# Patient Record
Sex: Female | Born: 1990 | Race: White | Hispanic: No | Marital: Married | Smoking: Never smoker
Health system: Southern US, Community
[De-identification: ages and names within clinical notes are randomized; demographics above are authoritative.]

## PROBLEM LIST (undated history)

## (undated) DIAGNOSIS — R569 Unspecified convulsions: Secondary | ICD-10-CM

## (undated) DIAGNOSIS — G43909 Migraine, unspecified, not intractable, without status migrainosus: Secondary | ICD-10-CM

## (undated) DIAGNOSIS — Z3490 Encounter for supervision of normal pregnancy, unspecified, unspecified trimester: Secondary | ICD-10-CM

## (undated) DIAGNOSIS — I7300 Raynaud's syndrome without gangrene: Secondary | ICD-10-CM

## (undated) DIAGNOSIS — D649 Anemia, unspecified: Secondary | ICD-10-CM

## (undated) HISTORY — PX: WISDOM TOOTH EXTRACTION: SHX21

## (undated) HISTORY — PX: TONSILLECTOMY: SUR1361

## (undated) HISTORY — DX: Migraine, unspecified, not intractable, without status migrainosus: G43.909

---

## 2013-04-02 ENCOUNTER — Emergency Department (HOSPITAL_COMMUNITY): Payer: Medicaid Other

## 2013-04-02 ENCOUNTER — Encounter (HOSPITAL_COMMUNITY): Payer: Self-pay | Admitting: *Deleted

## 2013-04-02 ENCOUNTER — Emergency Department (HOSPITAL_COMMUNITY)
Admission: EM | Admit: 2013-04-02 | Discharge: 2013-04-02 | Disposition: A | Discharge status: Home / Self Care | Payer: Medicaid Other | Attending: Emergency Medicine | Admitting: Emergency Medicine

## 2013-04-02 DIAGNOSIS — O3091 Multiple gestation, unspecified, first trimester: Secondary | ICD-10-CM

## 2013-04-02 DIAGNOSIS — F411 Generalized anxiety disorder: Secondary | ICD-10-CM | POA: Insufficient documentation

## 2013-04-02 DIAGNOSIS — R209 Unspecified disturbances of skin sensation: Secondary | ICD-10-CM | POA: Insufficient documentation

## 2013-04-02 DIAGNOSIS — O99340 Other mental disorders complicating pregnancy, unspecified trimester: Secondary | ICD-10-CM | POA: Insufficient documentation

## 2013-04-02 DIAGNOSIS — O30009 Twin pregnancy, unspecified number of placenta and unspecified number of amniotic sacs, unspecified trimester: Secondary | ICD-10-CM | POA: Insufficient documentation

## 2013-04-02 DIAGNOSIS — Z79899 Other long term (current) drug therapy: Secondary | ICD-10-CM | POA: Insufficient documentation

## 2013-04-02 HISTORY — DX: Encounter for supervision of normal pregnancy, unspecified, unspecified trimester: Z34.90

## 2013-04-02 HISTORY — DX: Unspecified convulsions: R56.9

## 2013-04-02 LAB — BASIC METABOLIC PANEL
BUN: 6 mg/dL (ref 6–23)
CO2: 23 mEq/L (ref 19–32)
Calcium: 9.2 mg/dL (ref 8.4–10.5)
Chloride: 102 mEq/L (ref 96–112)
Glucose, Bld: 87 mg/dL (ref 70–99)
Potassium: 3.4 mEq/L — ABNORMAL LOW (ref 3.5–5.1)

## 2013-04-02 LAB — CBC WITH DIFFERENTIAL/PLATELET
Basophils Absolute: 0 10*3/uL (ref 0.0–0.1)
Eosinophils Absolute: 0.1 10*3/uL (ref 0.0–0.7)
Eosinophils Relative: 1 % (ref 0–5)
Hemoglobin: 12.6 g/dL (ref 12.0–15.0)
Lymphocytes Relative: 23 % (ref 12–46)
Lymphs Abs: 1.4 10*3/uL (ref 0.7–4.0)
MCH: 28.5 pg (ref 26.0–34.0)
Monocytes Relative: 7 % (ref 3–12)
Neutro Abs: 4.4 10*3/uL (ref 1.7–7.7)
Neutrophils Relative %: 69 % (ref 43–77)
Platelets: 205 10*3/uL (ref 150–400)
RBC: 4.42 MIL/uL (ref 3.87–5.11)
RDW: 13.5 % (ref 11.5–15.5)
WBC: 6.4 10*3/uL (ref 4.0–10.5)

## 2013-04-02 LAB — HCG, QUANTITATIVE, PREGNANCY: hCG, Beta Chain, Quant, S: 84709 m[IU]/mL — ABNORMAL HIGH (ref <5)

## 2013-04-02 MED ORDER — SODIUM CHLORIDE 0.9 % IV BOLUS (SEPSIS)
1000.0000 mL | Freq: Once | INTRAVENOUS | Status: AC
  Administered 2013-04-02: 1000 mL via INTRAVENOUS

## 2013-04-02 NOTE — ED Provider Notes (Signed)
CSN: [id number]     Arrival date & time 04/02/13  1029 History   First MD Initiated Contact with Patient 04/02/13 1044     Chief Complaint  Patient presents with  . Numbness   (Consider location/radiation/quality/duration/timing/severity/associated sxs/prior Treatment) The history is provided by the patient and medical records.   Patient is G1PO approx [redacted] weeks gestation with twins in single gestational sac.  Pt arrived at her OB-GYN appt earlier this morning but when she got out of the car to go inside she began having some SOB and paresthesias of BUE, BUE, and left side of her face.  Mom notes pt does not have a hx of intermittent seizures following a head trauma several years ago.  Pt usually has prodrome of sx prior to having a seizure, some of which were present this morning.  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  Last seizure approx 2 years ago.  No recent fevers, sweats, or chills.  Denies any abdominal pain, vaginal bleeding, or discharge.  No dysuria or hematuria.  Pt is currently taking prenatal vitamins.  Has been having some morning sickness with typical nausea and vomiting but none for the past 3 days.  Has been eating and drinking well.  Pt expresses concern that she missed her u/s this morning and would like it completed here. OB-GYN-- Ernestina Penna  Past Medical History  Diagnosis Date  . Pregnancy   . Seizures    History reviewed. No pertinent past surgical history. History reviewed. No pertinent family history. History  Substance Use Topics  . Smoking status: Never Smoker   . Smokeless tobacco: Not on file  . Alcohol Use: No   OB History   Grav Para Term Preterm Abortions TAB SAB Ect Mult Living   1              Review of Systems  Neurological:       Paresthesias  All other systems reviewed and are negative.    Allergies  Dilantin; Dilaudid; and Phenobarbital  Home Medications    Current Outpatient Rx  Name  Route  Sig  Dispense  Refill  . flintstones complete (FLINTSTONES) 60 MG chewable tablet   Oral   Chew 2 tablets by mouth daily.         . folic acid (FOLVITE) 400 MCG tablet   Oral   Take 400 mcg by mouth daily.          BP 129/80  Pulse 96  Temp(Src) 99 F (37.2 C) (Oral)  Resp 17  SpO2 100%  Physical Exam  Nursing note and vitals reviewed. Constitutional: She is oriented to person, place, and time. She appears well-developed and well-nourished. No distress.  HENT:  Head: Normocephalic and atraumatic.  Mouth/Throat: Oropharynx is clear and moist.  Eyes: Conjunctivae and EOM are normal. Pupils are equal, round, and reactive to light.  Neck: Normal range of motion.  Cardiovascular: Normal rate, regular rhythm and normal heart sounds.   Pulmonary/Chest: Effort normal and breath sounds normal. No respiratory distress. She has no wheezes.  Abdominal: Soft. Bowel sounds are normal. There is no tenderness. There is no guarding.  Musculoskeletal: Normal range of motion.  Neurological: She is alert and oriented to person, place, and time. She has normal strength. She displays no tremor. No cranial nerve deficit or sensory deficit. She displays no seizure activity. Gait normal.  CN grossly intact, equal grip strength in BUE, moves all  extremities without ataxia, sensation intact, no facial droop appreciated  Skin: Skin is warm and dry. She is not diaphoretic.  Psychiatric: Her mood appears anxious.  Pt appears anxious, tearful on exam    ED Course  Procedures (including critical care time) Labs Review Labs Reviewed  BASIC METABOLIC PANEL - Abnormal; Notable for the following:    Potassium 3.4 (*)    Creatinine, Ser 0.49 (*)    All other components within normal limits  HCG, QUANTITATIVE, PREGNANCY - Abnormal; Notable for the following:    hCG, Beta Chain, Quant, S 40981 (*)    All other components within normal limits  CBC WITH DIFFERENTIAL    Imaging Review US Ob Comp Less 14 Wks  04/02/2013   CLINICAL DATA:  Abdominal pain. 7 weeks 2 days by LMP.  EXAM: TWIN OBSTETRIC <14WK [place] AND TRANSVAGINAL OB US  COMPARISON:  None.  FINDINGS: TWIN 1  Intrauterine gestational sac: Single  Yolk sac:  Present  Embryo:  Present  Cardiac Activity: Present  Heart Rate: 152 bpm  CRL:  12.5  mm   7 w 3 d                  [place] EDC: 11/16/2013  TWIN 2  Intrauterine gestational sac: Single  Yolk sac:  Present  Embryo:  Present  Cardiac Activity: Present  Heart Rate: 155 bpm  CRL:  13.6  mm   7 w 4d                  [place] EDC: 11/15/2012  Maternal uterus/adnexae: The ovaries have a normal appearance.  IMPRESSION: 1. Moderate amniotic monochorionic twin gestation corresponding to age of 7 weeks 3 days. By ultrasound, of the EDC is 11/16/2013. 2. Size and dates correlate well. 3. Ovaries have a normal appearance.   Electronically Signed   By: Rosalie Gums M.D.   On: 04/02/2013 13:15   US Ob Comp Addl Gest Less 14 Wks  04/02/2013   CLINICAL DATA:  Abdominal pain. 7 weeks 2 days by LMP.  EXAM: TWIN OBSTETRIC <14WK [place] AND TRANSVAGINAL OB US  COMPARISON:  None.  FINDINGS: TWIN 1  Intrauterine gestational sac: Single  Yolk sac:  Present  Embryo:  Present  Cardiac Activity: Present  Heart Rate: 152 bpm  CRL:  12.5  mm   7 w 3 d                  [place] EDC: 11/16/2013  TWIN 2  Intrauterine gestational sac: Single  Yolk sac:  Present  Embryo:  Present  Cardiac Activity: Present  Heart Rate: 155 bpm  CRL:  13.6  mm   7 w 4d                  [place] EDC: 11/15/2012  Maternal uterus/adnexae: The ovaries have a normal appearance.  IMPRESSION: 1. Moderate amniotic monochorionic twin gestation corresponding to age of 7 weeks 3 days. By ultrasound, of the EDC is 11/16/2013. 2. Size and dates correlate well. 3. Ovaries have a normal appearance.   Electronically Signed   By: Rosalie Gums M.D.   On: 04/02/2013 13:15    MDM   1. Multiple gestation, first trimester     11:01 AM Pt assessed,  appears anxious and tearful.  No focal neuro deficits appreciated on physical exam.  Will obtain basic labs and u/s.  Pt has not had a pelvic exam performed thus far in her pregnancy.  I  have offered to do this for her but she would prefer just to have u/s.  IVF bolus given.  2:04 PM Pt re-evaluated, sx resolved.  U/s as above-- both twins with normal cardiac activity.  Labs largely WNL.  Again offered pelvic exam to pt, states she would prefer to have this done with her OB-GYN at her appt next Monday 04/08/13.  Instructed to continue taking prenatals and monitor sx closely.  FU with OB-GYN as previously scheduled-- copies of labs and u/s given for physician review.  Discussed plan with pt and mom, they acknowledged understanding and agreed with plan of care.  Return precautions advised.  Garlon Hatchet, PA-C 04/02/13 1533

## 2013-04-02 NOTE — ED Notes (Signed)
Patient is [redacted] weeks pregnant (same sac)  with twins and today around 9am she started having numbess to her upper and lower extremities.  Patient has history of seizures.  Patient is alert and oriented xe.  Patient denies any nausea.  Patient is also experiencing shortness of breath.

## 2013-04-05 NOTE — ED Provider Notes (Signed)
Medical screening examination/treatment/procedure(s) were performed by non-physician practitioner and as supervising physician I was immediately available for consultation/collaboration.  Williom Cedar T Veyda Kaufman, MD 04/05/13 1800 

## 2014-05-12 ENCOUNTER — Encounter (HOSPITAL_COMMUNITY): Payer: Self-pay | Admitting: *Deleted

## 2015-05-11 ENCOUNTER — Telehealth: Payer: Self-pay | Admitting: Neurology

## 2015-05-11 ENCOUNTER — Encounter: Payer: Self-pay | Admitting: Neurology

## 2015-05-11 ENCOUNTER — Ambulatory Visit (INDEPENDENT_AMBULATORY_CARE_PROVIDER_SITE_OTHER): Payer: BLUE CROSS/BLUE SHIELD | Admitting: Neurology

## 2015-05-11 VITALS — BP 100/61 | HR 68 | Ht 59.0 in | Wt 112.0 lb

## 2015-05-11 DIAGNOSIS — G43709 Chronic migraine without aura, not intractable, without status migrainosus: Secondary | ICD-10-CM | POA: Diagnosis not present

## 2015-05-11 DIAGNOSIS — R569 Unspecified convulsions: Secondary | ICD-10-CM | POA: Diagnosis not present

## 2015-05-11 DIAGNOSIS — IMO0002 Reserved for concepts with insufficient information to code with codable children: Secondary | ICD-10-CM

## 2015-05-11 MED ORDER — NORTRIPTYLINE HCL 25 MG PO CAPS: ORAL_CAPSULE | ORAL | Status: DC

## 2015-05-11 MED ORDER — NORTRIPTYLINE HCL 25 MG PO CAPS: ORAL_CAPSULE | ORAL | Status: AC

## 2015-05-11 MED ORDER — SUMATRIPTAN SUCCINATE 50 MG PO TABS: 50.0000 mg | ORAL_TABLET | ORAL | Status: DC | PRN

## 2015-05-11 NOTE — Telephone Encounter (Signed)
Rx's have been resent, receipt confirmed by pharmacy.

## 2015-05-11 NOTE — Progress Notes (Signed)
PATIENT: Gina Thompson DOB: 01/29/1991  Chief Complaint  Patient presents with  . Headache    She started having daily headaches two months ago.  She has tried OTC NSAIDS without relief.  . Seizures    Says she was diagnosed with seizures in high school and placed on Topamax.  She started making bad grades and lost too much weight so it was discontinued.  She has not been on any other anticonvulsant.  Her last seizure was two years ago, during her pregnancy.     HISTORICAL  Gina Thompson is a 24 year old right-handed female, seen in refer by her primary care physician Dr. Satira Sark for evaluation of headaches in October 30 first 2016, she reported previous seizure-like activity  She reported a history of migraine since elementary school, after she was hit by ice ball, later basketball, her typical migraine starting from the left occipital region, spreading forward to become left retro-orbital area severe pounding headache with associated light noise sensitivity, nauseous, her headache can last for few days, trigger for her headaches are menstruation, stress, hungry sleep deprivation.  Her headache was at her best during her pregnancy about 2 years ago  Since August 2016, she began to have frequent headaches, she also complains depression, anxiety symptoms since she lost one of her twin boys, she now has almost daily moderate to severe headaches, especially since she started to go back to work,  She also reported seizure-like activity, initial episode was at second grade, proceeded by vision blackout, then loss of consciousness, woke up with severe headaches, over the years, she had few recurrent episode, usually triggered by stress, hormonal change, oftentimes with severe headaches, per patient, she had extensive evaluation during teenager years, had hospital admission at [facility], and hospitals at [place] region, normal MRI of the brain, EEG, the final conclusion was seizure-like  activity, related to the migraine,  Most recent passing out was during her pregnancy 2 years ago in 2014,  REVIEW OF SYSTEMS: Full 14 system review of systems performed and notable only for weight gain, fatigue, palpitation, ringing ears, loss of vision, eye pain, headaches, numbness, dizziness, tremor, not enough sleep,  ALLERGIES: Allergies  Allergen Reactions  . Dilantin [Phenytoin Sodium Extended]     Shortness of breath  . Dilaudid [Hydromorphone Hcl]     Induce seizures  . Phenobarbital     Shortness of breath    HOME MEDICATIONS: Current Outpatient Prescriptions  Medication Sig Dispense Refill  . Acetaminophen (TYLENOL PO) Take by mouth as needed.    . Aspirin-Acetaminophen-Caffeine (EXCEDRIN EXTRA STRENGTH PO) Take by mouth as needed.    Marland Kitchen ibuprofen (ADVIL,MOTRIN) 200 MG tablet Take 200 mg by mouth as needed.     No current facility-administered medications for this visit.    PAST MEDICAL HISTORY: Past Medical History  Diagnosis Date  . Pregnancy   . Seizures (HCC)   . Migraine     PAST SURGICAL HISTORY: Past Surgical History  Procedure Laterality Date  . Tonsillectomy    . Wisdom tooth extraction    . Cesarean section      FAMILY HISTORY: Family History  Problem Relation Age of Onset  . Bone cancer Father   . Brain cancer Paternal Grandfather   . Melanoma Paternal Grandfather   . Thyroid disease      All four grandparents  . Healthy Mother     SOCIAL HISTORY:  Social History   Social History  . Marital Status: Married  Spouse Name: N/A  . Number of Children: 2  . Years of Education: Some clg   Occupational History  . Customer Service Rep    Social History Main Topics  . Smoking status: Never Smoker   . Smokeless tobacco: Not on file  . Alcohol Use: 0.0 oz/week    0 Standard drinks or equivalent per week     Comment: Rarely - social settings  . Drug Use: No  . Sexual Activity: Not on file   Other Topics Concern  . Not on file    Social History Narrative   Lives at home with husband and son.  She had twins but one passed away.   Right-handed.   2-4 cups caffeine daily.     PHYSICAL EXAM   Filed Vitals:   05/11/15 0810  BP: 100/61  Pulse: 68  Height:  (1.499 m)  Weight: 112 lb (50.803 kg)    Not recorded      Body mass index is 22.61 kg/(m^2).  PHYSICAL EXAMNIATION:  Gen: NAD, conversant, well nourised, obese, well groomed                     Cardiovascular: Regular rate rhythm, no peripheral edema, warm, nontender. Eyes: Conjunctivae clear without exudates or hemorrhage Neck: Supple, no carotid bruise. Pulmonary: Clear to auscultation bilaterally   NEUROLOGICAL EXAM:  MENTAL STATUS: Speech:    Speech is normal; fluent and spontaneous with normal comprehension.  Cognition:     Orientation to time, place and person     Normal recent and remote memory     Normal Attention span and concentration     Normal Language, naming, repeating,spontaneous speech     Fund of knowledge   CRANIAL NERVES: CN II: Visual fields are full to confrontation. Fundoscopic exam is normal with sharp discs and no vascular changes. Pupils are round equal and briskly reactive to light. CN III, IV, VI: extraocular movement are normal. No ptosis. CN V: Facial sensation is intact to pinprick in all 3 divisions bilaterally. Corneal responses are intact.  CN VII: Face is symmetric with normal eye closure and smile. CN VIII: Hearing is normal to rubbing fingers CN IX, X: Palate elevates symmetrically. Phonation is normal. CN XI: Head turning and shoulder shrug are intact CN XII: Tongue is midline with normal movements and no atrophy.  MOTOR: There is no pronator drift of out-stretched arms. Muscle bulk and tone are normal. Muscle strength is normal.  REFLEXES: Reflexes are 2+ and symmetric at the biceps, triceps, knees, and ankles. Plantar responses are flexor.  SENSORY: Intact to light touch, pinprick,  position sense, and vibration sense are intact in fingers and toes.  COORDINATION: Rapid alternating movements and fine finger movements are intact. There is no dysmetria on finger-to-nose and heel-knee-shin.    GAIT/STANCE: Posture is normal. Gait is steady with normal steps, base, arm swing, and turning. Heel and toe walking are normal. Tandem gait is normal.  Romberg is absent.   DIAGNOSTIC DATA (LABS, IMAGING, TESTING) - I reviewed patient records, labs, notes, testing and imaging myself where available.   ASSESSMENT AND PLAN  Gina Thompson is a 24 y.o. female   Chronic migraine with visual aura  Nortriptyline 25 mg titrating to 50 mg every night as preventative medications  Imitrex 50 mg as needed Depression Seizure-like activity  EEG   Levert Feinstein, M.D. Ph.D.  [facility] [address], [address] [place], [place] [address] Ph: [phone] Fax: [phone]  CC: Referring Provider

## 2015-05-11 NOTE — Telephone Encounter (Signed)
Patient is calling and states that [facility] [address]  Could you please resend Rx Nortriptyline 25 mg and Rx Sumatriptan 50 mg.  Thanks!

## 2015-05-13 ENCOUNTER — Telehealth: Payer: Self-pay | Admitting: Neurology

## 2015-05-13 ENCOUNTER — Encounter: Payer: Self-pay | Admitting: *Deleted

## 2015-05-13 ENCOUNTER — Other Ambulatory Visit: Payer: Self-pay | Admitting: *Deleted

## 2015-05-13 DIAGNOSIS — G43809 Other migraine, not intractable, without status migrainosus: Secondary | ICD-10-CM

## 2015-05-13 MED ORDER — RIZATRIPTAN BENZOATE 5 MG PO TBDP: 5.0000 mg | ORAL_TABLET | ORAL | Status: DC | PRN

## 2015-05-13 NOTE — Telephone Encounter (Signed)
Patient needed rx sent to different pharmacy - changed and resent - other rx canceled.

## 2015-05-13 NOTE — Telephone Encounter (Signed)
Spoke to [place] - she used Imitrex for the first time yesterday - says it is too sedating.  Her employer made her leave work.  She is requesting an alternate medication to try that will not cause her to feel drowsy.

## 2015-05-13 NOTE — Telephone Encounter (Signed)
Pt called and states that she can not function while taking SUMAtriptan (IMITREX) 50 MG tablet. States that her migraines were worse as well. Please call and advise [phone]

## 2015-05-13 NOTE — Telephone Encounter (Signed)
Please let patient know, I have written Maxalt 5 mg as needed

## 2015-06-02 ENCOUNTER — Other Ambulatory Visit: Payer: Self-pay | Admitting: *Deleted

## 2015-06-02 ENCOUNTER — Encounter: Payer: Self-pay | Admitting: *Deleted

## 2015-06-02 DIAGNOSIS — G43809 Other migraine, not intractable, without status migrainosus: Secondary | ICD-10-CM

## 2015-06-02 MED ORDER — DICLOFENAC POTASSIUM(MIGRAINE) 50 MG PO PACK: PACK | ORAL | Status: AC

## 2015-06-02 NOTE — Telephone Encounter (Signed)
Left message for a return call

## 2015-06-02 NOTE — Telephone Encounter (Signed)
Pt called to r/s EEG( appt is now 12/22). She sts cannot take nortriptyline (PAMELOR) 25 MG capsule 2 tab at night as she cannnot function the next day although the HA's have improved in frequency since starting this medication9 taking 1 pill). She can not take Maxalt at all, cannot function. So she has no medication to take during the day. She is wondering if she should keep appt 11/5 or move to after EEG. Please call and advise

## 2015-06-02 NOTE — Telephone Encounter (Signed)
Per Dr. Terrace ArabiaYan, decrease nortriptyline to 25mg  at bedtime, try Cambia and keep follow up appt on 06/15/15.  Patient agreeable to plan.

## 2015-06-10 ENCOUNTER — Other Ambulatory Visit: Payer: BLUE CROSS/BLUE SHIELD

## 2015-06-15 ENCOUNTER — Ambulatory Visit (INDEPENDENT_AMBULATORY_CARE_PROVIDER_SITE_OTHER): Payer: BLUE CROSS/BLUE SHIELD | Admitting: Neurology

## 2015-06-15 ENCOUNTER — Encounter: Payer: Self-pay | Admitting: Neurology

## 2015-06-15 VITALS — BP 96/65 | HR 94 | Ht 59.0 in | Wt 111.0 lb

## 2015-06-15 DIAGNOSIS — G43009 Migraine without aura, not intractable, without status migrainosus: Secondary | ICD-10-CM

## 2015-06-15 DIAGNOSIS — R569 Unspecified convulsions: Secondary | ICD-10-CM | POA: Insufficient documentation

## 2015-06-15 DIAGNOSIS — G43909 Migraine, unspecified, not intractable, without status migrainosus: Secondary | ICD-10-CM | POA: Insufficient documentation

## 2015-06-15 MED ORDER — DIHYDROERGOTAMINE MESYLATE 4 MG/ML NA SOLN: 1.0000 | NASAL | Status: AC | PRN

## 2015-06-15 MED ORDER — NORTRIPTYLINE HCL 10 MG PO CAPS: ORAL_CAPSULE | ORAL | Status: AC

## 2015-06-15 NOTE — Progress Notes (Signed)
Chief Complaint  Patient presents with  . Migraine    She is only able to tolerate nortriptyline  at bedtime.  Reports her headaches have decreased but she is still getting 2-3 per week.  Cambia relieves her pain but does not resolve it completely.  . Seizures    No seizure activity reported.  She has a pending EEG on 07/02/15.      PATIENT: Gina Thompson DOB: 07/24/1990  Chief Complaint  Patient presents with  . Migraine    She is only able to tolerate nortriptyline  at bedtime.  Reports her headaches have decreased but she is still getting 2-3 per week.  Cambia relieves her pain but does not resolve it completely.  . Seizures    No seizure activity reported.  She has a pending EEG on 07/02/15.     HISTORICAL  Gina Thompson is a 24 year old right-handed female, seen in refer by her primary care physician Dr. Satira Sark for evaluation of headaches in October 30 first 2016, she reported previous seizure-like activity  She reported a history of migraine since elementary school, after she was hit by ice ball, later basketball, her typical migraine starting from the left occipital region, spreading forward to become left retro-orbital area severe pounding headache with associated light noise sensitivity, nauseous, her headache can last for few days, trigger for her headaches are menstruation, stress, hungry sleep deprivation.  Her headache was at her best during her pregnancy about 2 years ago  Since August 2016, she began to have frequent headaches, she also complains depression, anxiety symptoms since she lost one of her twin boys, she now has almost daily moderate to severe headaches, especially since she started to go back to work,  She also reported seizure-like activity, initial episode was at second grade, proceeded by vision blackout, then loss of consciousness, woke up with severe headaches, over the years, she had few recurrent episode, usually triggered by stress, hormonal  change, oftentimes with severe headaches, per patient, she had extensive evaluation during teenager years, had hospital admission at [facility], and hospitals at [place] region, normal MRI of the brain, EEG, the final conclusion was seizure-like activity, related to the migraine,  Most recent passing out was during her pregnancy 2 years ago in 2014,  UPDATE Dec 5th 2016: She is able to tolerate nortriptyline 25 mg every night, which did help her headache, she has less headache now, at least 50% less, her typical migraine last about one day,Cambia does help, but does not completely take it away.  Her mood has improved with nortriptyline  She has tried imitrex, maxalt, she could not remember anything after taking it, it does not help her migraine.   REVIEW OF SYSTEMS: Full 14 system review of systems performed and notable only for palpitation, constipation, headaches  ALLERGIES: Allergies  Allergen Reactions  . Dilantin [Phenytoin Sodium Extended]     Shortness of breath  . Dilaudid [Hydromorphone Hcl]     Induce seizures  . Phenobarbital     Shortness of breath    HOME MEDICATIONS: Current Outpatient Prescriptions  Medication Sig Dispense Refill  . Acetaminophen (TYLENOL PO) Take by mouth as needed.    . Diclofenac Potassium (CAMBIA) 50 MG PACK Take one pack at onset of migraine.  May repeat in two hours, if needed.  Max of two packs in 24 hours. 12 each 5  . ibuprofen (ADVIL,MOTRIN) 200 MG tablet Take 200 mg by mouth as needed.    . nortriptyline (PAMELOR)  25 MG capsule 1 tablet every night for one week, then 2 tablets every night 60 capsule 3   No current facility-administered medications for this visit.    PAST MEDICAL HISTORY: Past Medical History  Diagnosis Date  . Pregnancy   . Seizures (HCC)   . Migraine     PAST SURGICAL HISTORY: Past Surgical History  Procedure Laterality Date  . Tonsillectomy    . Wisdom tooth extraction    . Cesarean section      FAMILY  HISTORY: Family History  Problem Relation Age of Onset  . Bone cancer Father   . Brain cancer Paternal Grandfather   . Melanoma Paternal Grandfather   . Thyroid disease      All four grandparents  . Healthy Mother     SOCIAL HISTORY:  Social History   Social History  . Marital Status: Married    Spouse Name: N/A  . Number of Children: 2  . Years of Education: Some clg   Occupational History  . Customer Service Rep    Social History Main Topics  . Smoking status: Never Smoker   . Smokeless tobacco: Not on file  . Alcohol Use: 0.0 oz/week    0 Standard drinks or equivalent per week     Comment: Rarely - social settings  . Drug Use: No  . Sexual Activity: Not on file   Other Topics Concern  . Not on file   Social History Narrative   Lives at home with husband and son.  She had twins but one passed away.   Right-handed.   2-4 cups caffeine daily.     PHYSICAL EXAM   Filed Vitals:   06/15/15 0755  BP: 96/65  Pulse: 94  Height: 4\' 11"  (1.499 m)  Weight: 111 lb (50.349 kg)    Not recorded      Body mass index is 22.41 kg/(m^2).  PHYSICAL EXAMNIATION:  Gen: NAD, conversant, well nourised, obese, well groomed                     Cardiovascular: Regular rate rhythm, no peripheral edema, warm, nontender. Eyes: Conjunctivae clear without exudates or hemorrhage Neck: Supple, no carotid bruise. Pulmonary: Clear to auscultation bilaterally   NEUROLOGICAL EXAM:  MENTAL STATUS: Speech:    Speech is normal; fluent and spontaneous with normal comprehension.  Cognition:     Orientation to time, place and person     Normal recent and remote memory     Normal Attention span and concentration     Normal Language, naming, repeating,spontaneous speech     Fund of knowledge   CRANIAL NERVES: CN II: Visual fields are full to confrontation. Fundoscopic exam is normal with sharp discs and no vascular changes. Pupils are round equal and briskly reactive to light. CN  III, IV, VI: extraocular movement are normal. No ptosis. CN V: Facial sensation is intact to pinprick in all 3 divisions bilaterally. Corneal responses are intact.  CN VII: Face is symmetric with normal eye closure and smile. CN VIII: Hearing is normal to rubbing fingers CN IX, X: Palate elevates symmetrically. Phonation is normal. CN XI: Head turning and shoulder shrug are intact CN XII: Tongue is midline with normal movements and no atrophy.  MOTOR: There is no pronator drift of out-stretched arms. Muscle bulk and tone are normal. Muscle strength is normal.  REFLEXES: Reflexes are 2+ and symmetric at the biceps, triceps, knees, and ankles. Plantar responses are flexor.  SENSORY: Intact to  light touch, pinprick, position sense, and vibration sense are intact in fingers and toes.  COORDINATION: Rapid alternating movements and fine finger movements are intact. There is no dysmetria on finger-to-nose and heel-knee-shin.    GAIT/STANCE: Posture is normal. Gait is steady with normal steps, base, arm swing, and turning. Heel and toe walking are normal. Tandem gait is normal.  Romberg is absent.   DIAGNOSTIC DATA (LABS, IMAGING, TESTING) - I reviewed patient records, labs, notes, testing and imaging myself where available.   ASSESSMENT AND PLAN  Delmy Holdren is a 24 y.o. female   Chronic migraine with visual aura  Nortriptyline 25 mg titrating to 50 mg every night as preventative medications  Cambia  as needed. May add on Benadryl, Tylenol prn  Depression Seizure-like activity  EEG   Levert Feinstein, M.D. Ph.D.  [facility] [address] [place], [place] [address] Ph: [phone] Fax: [phone]  CC: Referring Provider

## 2015-06-15 NOTE — Patient Instructions (Signed)
You may take Benadryl, even Tylenol together with Cambia during severe prolonged migraine headaches

## 2015-07-02 ENCOUNTER — Ambulatory Visit (INDEPENDENT_AMBULATORY_CARE_PROVIDER_SITE_OTHER): Payer: BLUE CROSS/BLUE SHIELD

## 2015-07-02 DIAGNOSIS — IMO0002 Reserved for concepts with insufficient information to code with codable children: Secondary | ICD-10-CM

## 2015-07-02 DIAGNOSIS — G43709 Chronic migraine without aura, not intractable, without status migrainosus: Secondary | ICD-10-CM

## 2015-07-02 DIAGNOSIS — R569 Unspecified convulsions: Secondary | ICD-10-CM

## 2015-07-02 NOTE — Procedures (Signed)
    History:  Gina Thompson is a 24 year old patient with a history of migraine headaches, she has had episodes of loss of consciousness preceded by vision blackout and accompanied by headache since she was in second grade. The patient is being evaluated for these episodes.  This is a routine EEG. No skull defects are noted. Medications include Tylenol, Cambia, Migranal, ibuprofen, and nortriptyline.   EEG classification: Normal awake  Description of the recording: The background rhythms of this recording consists of a fairly well modulated medium amplitude alpha rhythm of 9 Hz that is reactive to eye opening and closure. As the record progresses, the patient appears to remain in the waking state throughout the recording. Photic stimulation was performed, resulting in a bilateral and symmetric photic driving response. Hyperventilation was also performed, resulting in a minimal buildup of the background rhythm activities without significant slowing seen. At no time during the recording does there appear to be evidence of spike or spike wave discharges or evidence of focal slowing. EKG monitor shows no evidence of cardiac rhythm abnormalities with a heart rate of 72.  Impression: This is a normal EEG recording in the waking state. No evidence of ictal or interictal discharges are seen.

## 2015-07-07 ENCOUNTER — Telehealth: Payer: Self-pay

## 2015-07-07 NOTE — Telephone Encounter (Signed)
Left voicemail with results.

## 2015-07-07 NOTE — Telephone Encounter (Signed)
-----   Message from York Spanielharles K Willis, MD sent at 07/02/2015  6:24 PM EST ----- The EEG study on this patient was normal. Please contact the patient.

## 2015-09-15 ENCOUNTER — Ambulatory Visit: Payer: BLUE CROSS/BLUE SHIELD | Admitting: Adult Health

## 2016-01-25 LAB — OB RESULTS CONSOLE HEPATITIS B SURFACE ANTIGEN: Hepatitis B Surface Ag: NEGATIVE

## 2016-01-25 LAB — OB RESULTS CONSOLE RUBELLA ANTIBODY, IGM: RUBELLA: IMMUNE

## 2016-01-25 LAB — OB RESULTS CONSOLE GC/CHLAMYDIA
Chlamydia: NEGATIVE
GC PROBE AMP, GENITAL: NEGATIVE

## 2016-01-25 LAB — OB RESULTS CONSOLE ANTIBODY SCREEN: Antibody Screen: NEGATIVE

## 2016-01-25 LAB — OB RESULTS CONSOLE ABO/RH: RH Type: POSITIVE

## 2016-01-25 LAB — OB RESULTS CONSOLE RPR: RPR: NONREACTIVE

## 2016-01-25 LAB — OB RESULTS CONSOLE HIV ANTIBODY (ROUTINE TESTING): HIV: NONREACTIVE

## 2016-06-08 ENCOUNTER — Inpatient Hospital Stay (HOSPITAL_COMMUNITY): Payer: BLUE CROSS/BLUE SHIELD

## 2016-06-08 ENCOUNTER — Encounter (HOSPITAL_COMMUNITY): Payer: Self-pay | Admitting: *Deleted

## 2016-06-08 ENCOUNTER — Inpatient Hospital Stay (HOSPITAL_COMMUNITY)
Admission: AD | Admit: 2016-06-08 | Discharge: 2016-06-09 | Disposition: A | Discharge status: Home / Self Care | Payer: BLUE CROSS/BLUE SHIELD | Source: Ambulatory Visit | Attending: Emergency Medicine | Admitting: Emergency Medicine

## 2016-06-08 DIAGNOSIS — R0602 Shortness of breath: Secondary | ICD-10-CM | POA: Diagnosis not present

## 2016-06-08 DIAGNOSIS — E876 Hypokalemia: Secondary | ICD-10-CM | POA: Insufficient documentation

## 2016-06-08 DIAGNOSIS — Z79899 Other long term (current) drug therapy: Secondary | ICD-10-CM | POA: Insufficient documentation

## 2016-06-08 DIAGNOSIS — R079 Chest pain, unspecified: Secondary | ICD-10-CM

## 2016-06-08 DIAGNOSIS — R071 Chest pain on breathing: Secondary | ICD-10-CM | POA: Diagnosis not present

## 2016-06-08 DIAGNOSIS — O9989 Other specified diseases and conditions complicating pregnancy, childbirth and the puerperium: Secondary | ICD-10-CM | POA: Insufficient documentation

## 2016-06-08 DIAGNOSIS — D509 Iron deficiency anemia, unspecified: Secondary | ICD-10-CM | POA: Diagnosis present

## 2016-06-08 DIAGNOSIS — Z3A29 29 weeks gestation of pregnancy: Secondary | ICD-10-CM | POA: Diagnosis not present

## 2016-06-08 DIAGNOSIS — A419 Sepsis, unspecified organism: Secondary | ICD-10-CM

## 2016-06-08 LAB — COMPREHENSIVE METABOLIC PANEL
ALK PHOS: 93 U/L (ref 38–126)
ALT: 10 U/L — ABNORMAL LOW (ref 14–54)
ANION GAP: 6 (ref 5–15)
AST: 14 U/L — ABNORMAL LOW (ref 15–41)
Albumin: 2.6 g/dL — ABNORMAL LOW (ref 3.5–5.0)
BILIRUBIN TOTAL: 0.3 mg/dL (ref 0.3–1.2)
BUN: 6 mg/dL (ref 6–20)
CO2: 21 mmol/L — ABNORMAL LOW (ref 22–32)
Calcium: 7.9 mg/dL — ABNORMAL LOW (ref 8.9–10.3)
Chloride: 107 mmol/L (ref 101–111)
Creatinine, Ser: 0.46 mg/dL (ref 0.44–1.00)
GFR calc Af Amer: >60 mL/min (ref >60)
Glucose, Bld: 98 mg/dL (ref 65–99)
POTASSIUM: 2.9 mmol/L — AB (ref 3.5–5.1)
Sodium: 134 mmol/L — ABNORMAL LOW (ref 135–145)
TOTAL PROTEIN: 6 g/dL — AB (ref 6.5–8.1)

## 2016-06-08 LAB — CBC
HCT: 24.3 % — ABNORMAL LOW (ref 36.0–46.0)
Hemoglobin: 8 g/dL — ABNORMAL LOW (ref 12.0–15.0)
MCH: 26.5 pg (ref 26.0–34.0)
MCHC: 32.9 g/dL (ref 30.0–36.0)
MCV: 80.5 fL (ref 78.0–100.0)
Platelets: 259 10*3/uL (ref 150–400)
RBC: 3.02 MIL/uL — ABNORMAL LOW (ref 3.87–5.11)
RDW: 13.9 % (ref 11.5–15.5)
WBC: 9.7 10*3/uL (ref 4.0–10.5)

## 2016-06-08 LAB — I-STAT TROPONIN, ED: TROPONIN I, POC: 0 ng/mL (ref 0.00–0.08)

## 2016-06-08 LAB — TYPE AND SCREEN
ABO/RH(D): A POS
ANTIBODY SCREEN: NEGATIVE

## 2016-06-08 LAB — I-STAT CG4 LACTIC ACID, ED: Lactic Acid, Venous: 0.49 mmol/L — ABNORMAL LOW (ref 0.5–1.9)

## 2016-06-08 LAB — MAGNESIUM
MAGNESIUM: 0.6 mg/dL — AB (ref 1.7–2.4)
Magnesium: 3.1 mg/dL — ABNORMAL HIGH (ref 1.7–2.4)

## 2016-06-08 LAB — GLUCOSE, CAPILLARY: GLUCOSE-CAPILLARY: 98 mg/dL (ref 65–99)

## 2016-06-08 LAB — BRAIN NATRIURETIC PEPTIDE: B Natriuretic Peptide: 30 pg/mL (ref 0.0–100.0)

## 2016-06-08 LAB — LACTIC ACID, PLASMA: LACTIC ACID, VENOUS: 1.4 mmol/L (ref 0.5–1.9)

## 2016-06-08 LAB — ABO/RH: ABO/RH(D): A POS

## 2016-06-08 LAB — APTT: APTT: 26 s (ref 24–36)

## 2016-06-08 LAB — D-DIMER, QUANTITATIVE: D-Dimer, Quant: 1.24 ug/mL-FEU — ABNORMAL HIGH (ref 0.00–0.50)

## 2016-06-08 LAB — TROPONIN I: TROPONIN I: 0.1 ng/mL — AB (ref <0.03)

## 2016-06-08 LAB — PROTIME-INR
INR: 1.06
Prothrombin Time: 13.9 seconds (ref 11.4–15.2)

## 2016-06-08 LAB — POTASSIUM: Potassium: 3.5 mmol/L (ref 3.5–5.1)

## 2016-06-08 LAB — PROCALCITONIN

## 2016-06-08 LAB — FETAL FIBRONECTIN: Fetal Fibronectin: NEGATIVE

## 2016-06-08 MED ORDER — MAGNESIUM SULFATE 2 GM/50ML IV SOLN
2.0000 g | Freq: Once | INTRAVENOUS | Status: AC
  Administered 2016-06-08: 2 g via INTRAVENOUS
  Filled 2016-06-08: qty 50

## 2016-06-08 MED ORDER — LABETALOL HCL 5 MG/ML IV SOLN: 20.0000 mg | INTRAVENOUS | Status: DC | PRN

## 2016-06-08 MED ORDER — MAGNESIUM SULFATE 50 % IJ SOLN
2.0000 g/h | INTRAVENOUS | Status: DC
  Filled 2016-06-08: qty 80

## 2016-06-08 MED ORDER — SODIUM CHLORIDE 0.9 % IV BOLUS (SEPSIS)
1000.0000 mL | Freq: Once | INTRAVENOUS | Status: AC
  Administered 2016-06-08: 1000 mL via INTRAVENOUS

## 2016-06-08 MED ORDER — POTASSIUM CHLORIDE CRYS ER 20 MEQ PO TBCR
40.0000 meq | EXTENDED_RELEASE_TABLET | Freq: Once | ORAL | Status: AC
  Administered 2016-06-08: 40 meq via ORAL
  Filled 2016-06-08: qty 2

## 2016-06-08 MED ORDER — IOPAMIDOL (ISOVUE-370) INJECTION 76%
100.0000 mL | Freq: Once | INTRAVENOUS | Status: AC | PRN
  Administered 2016-06-08: 100 mL via INTRAVENOUS

## 2016-06-08 MED ORDER — SODIUM CHLORIDE 0.9 % IV SOLN
INTRAVENOUS | Status: DC
  Filled 2016-06-08 (×2): qty 1000

## 2016-06-08 MED ORDER — POTASSIUM CHLORIDE CRYS ER 20 MEQ PO TBCR: 20.0000 meq | EXTENDED_RELEASE_TABLET | Freq: Two times a day (BID) | ORAL | 0 refills | Status: AC

## 2016-06-08 MED ORDER — ASPIRIN 81 MG PO CHEW
324.0000 mg | CHEWABLE_TABLET | Freq: Once | ORAL | Status: AC
  Administered 2016-06-08: 324 mg via ORAL
  Filled 2016-06-08: qty 4

## 2016-06-08 MED ORDER — POTASSIUM CHLORIDE 20 MEQ/15ML (10%) PO SOLN
40.0000 meq | Freq: Every day | ORAL | Status: DC
  Administered 2016-06-08: 40 meq via ORAL
  Filled 2016-06-08: qty 30

## 2016-06-08 MED ORDER — HYDRALAZINE HCL 20 MG/ML IJ SOLN: 5.0000 mg | INTRAMUSCULAR | Status: DC | PRN

## 2016-06-08 MED ORDER — MAGNESIUM SULFATE BOLUS VIA INFUSION
4.0000 g | Freq: Once | INTRAVENOUS | Status: DC
Start: 2016-06-08 — End: 2016-06-09
  Filled 2016-06-08: qty 500

## 2016-06-08 NOTE — ED Provider Notes (Signed)
[facility]-EMER[facility]ENCY DEPT Provider Note   CSN: [id number] Arrival date & time: 06/08/16  1232     History   Chief Complaint Chief Complaint  Patient presents with  . Chest Pain  . Shortness of Breath    HPI Gina Thompson is a 25 y.o. female.  HPI  This is a 25 year old G2 P1 [redacted] weeks pregnant presents to [place] ED [facility]. She complains of left anterior chest pain that began last night and has been sharp in nature at 8-9 out of 10 in severity. It is worsened with inspiration . She denies any nasal congestion, cough, abdominal pain.  [facility] EKG with T-wave inversion in anterior leads, elevated troponin at 0.1, hypokalemia at 2.9, anemia at 8.0, and hypomagnesemia at 0.6. She had a lactic acid checked that was normal. She had a MAXIMUM TEMPERATURE of 100.9. She has been mildly tachycardic and systolic blood pressures have ranged from 76-101 with one outlier of 176 which I suspect was not correct.  CT angiogram of the chest revealed no evidence of pulmonary embolism, pericardial effusion, or infiltrate. She had some slight pulmonary effusions. MAU note reports that patient was obstetric stable with fetal heart rate category 1 with moderate or ability and no contractions. They report cervix closed and thick.   Past Medical History:  Diagnosis Date  . Migraine   . Pregnancy   . Seizures [facility](HCC)     Patient Active Problem List   Diagnosis Date Noted  . Migraine 06/15/2015  . Seizure-like activity (HCC) 06/15/2015    Past Surgical History:  Procedure Laterality Date  . CESAREAN SECTION    . TONSILLECTOMY    . WISDOM TOOTH EXTRACTION      OB History    Gravida Para Term Preterm AB Living   2             SAB TAB Ectopic Multiple Live Births                   Home Medications    Prior to Admission medications   Medication Sig Start Date End Date Taking? Authorizing Provider    Acetaminophen (TYLENOL PO) Take by mouth as needed.    Historical Provider, MD  Diclofenac Potassium (CAMBIA) 50 MG PACK Take one pack at onset of migraine.  May repeat in two hours, if needed.  Max of two packs in 24 hours. 06/02/15   Levert FeinsteinYijun Yan, MD  dihydroergotamine (MIGRANAL) 4 MG/ML nasal spray Place 1 spray into the nose as needed for migraine. Use in one nostril as directed.  No more than 4 sprays in one hour 06/15/15   Levert FeinsteinYijun Yan, MD  ibuprofen (ADVIL,MOTRIN) 200 MG tablet Take 200 mg by mouth as needed.    Historical Provider, MD  nortriptyline (PAMELOR) 10 MG capsule Take with nortriptyline 25 mg tablets, titrating to 50 mg every night 06/15/15   Levert FeinsteinYijun Yan, MD  nortriptyline (PAMELOR) 25 MG capsule 1 tablet every night for one week, then 2 tablets every night 05/11/15   Levert FeinsteinYijun Yan, MD    Family History Family History  Problem Relation Age of Onset  . Bone cancer Father   . Healthy Mother   . Brain cancer Paternal Grandfather   . Melanoma Paternal Grandfather   . Thyroid disease      All four grandparents    Social History Social History  Substance Use Topics  . Smoking status: Never Smoker  .  Smokeless tobacco: Not on file  . Alcohol use 0.0 oz/week     Comment: Rarely - social settings     Allergies   Dilantin [phenytoin sodium extended]; Dilaudid [hydromorphone hcl]; and Phenobarbital   Review of Systems Review of Systems   Physical Exam Updated Vital Signs BP 99/62 (BP Location: Right Arm)   Pulse 103   Temp 99.6 F (37.6 C) (Oral)   Resp 16   Wt 60.1 kg   SpO2 100%   BMI 26.78 kg/m   Physical Exam  Constitutional: She is oriented to person, place, and time. She appears well-developed and well-nourished.  HENT:  Head: Normocephalic and atraumatic.  Right Ear: External ear normal.  Left Ear: External ear normal.  Nose: Nose normal.  Mouth/Throat: Oropharynx is clear and moist.  Eyes: Conjunctivae and EOM are normal. Pupils are equal, round, and  reactive to light.  Neck: Normal range of motion. Neck supple.  Cardiovascular: Tachycardia present.   Pulmonary/Chest: Effort normal and breath sounds normal.  Abdominal: Soft. Bowel sounds are normal.  Gravid consistent with dates  Musculoskeletal: Normal range of motion.  Neurological: She is alert and oriented to person, place, and time.  Skin: Skin is warm. Capillary refill takes less than 2 seconds. There is pallor.  Psychiatric: She has a normal mood and affect.  Nursing note and vitals reviewed.    ED Treatments / Results  Labs (all labs ordered are listed, but only abnormal results are displayed) Labs Reviewed  CBC - Abnormal; Notable for the following:       Result Value   RBC 3.02 (*)    Hemoglobin 8.0 (*)    HCT 24.3 (*)    All other components within normal limits  COMPREHENSIVE METABOLIC PANEL - Abnormal; Notable for the following:    Sodium 134 (*)    Potassium 2.9 (*)    CO2 21 (*)    Calcium 7.9 (*)    Total Protein 6.0 (*)    Albumin 2.6 (*)    AST 14 (*)    ALT 10 (*)    All other components within normal limits  TROPONIN I - Abnormal; Notable for the following:    Troponin I 0.10 (*)    All other components within normal limits  D-DIMER, QUANTITATIVE (NOT AT [facility]) - Abnormal; Notable for the following:    D-Dimer, Quant 1.24 (*)    All other components within normal limits  MAGNESIUM - Abnormal; Notable for the following:    Magnesium 0.6 (*)    All other components within normal limits  CULTURE, BLOOD (ROUTINE X 2)  CULTURE, BLOOD (ROUTINE X 2)  CULTURE, OB URINE  GLUCOSE, CAPILLARY  BRAIN NATRIURETIC PEPTIDE  LACTIC ACID, PLASMA  PROTIME-INR  APTT  FETAL FIBRONECTIN  PROTEIN / CREATININE RATIO, URINE  TROPONIN I  TROPONIN I  LACTIC ACID, PLASMA  PROCALCITONIN  TYPE AND SCREEN  ABO/RH    EKG  EKG Interpretation  Date/Time:  Wednesday June 08 2016 16:51:43 EST Ventricular Rate:  104 PR Interval:    QRS Duration: 75 QT  Interval:  346 QTC Calculation: 456 R Axis:   58 Text Interpretation:  Sinus tachycardia Confirmed by Ayleen Mckinstry MD, Duwayne Heck [phone]) on 06/08/2016 5:52:22 PM       Radiology Ct Angio Chest Pe W And/or Wo Contrast  Result Date: 06/08/2016 CLINICAL DATA:  Chest pain. Severe shortness of breath. Third trimester pregnancy. Twenty-nine weeks pregnant. EXAM: CT ANGIOGRAPHY CHEST WITH CONTRAST TECHNIQUE: Multidetector CT imaging of  the chest was performed using the standard protocol during bolus administration of intravenous contrast. Multiplanar CT image reconstructions and MIPs were obtained to evaluate the vascular anatomy. CONTRAST:  100 mL Isovue 370 COMPARISON:  None. FINDINGS: Cardiovascular: Pulmonary trial opacification is excellent. No focal filling defects are evident to suggest pulmonary emboli. The heart size is normal. No significant pleural or pericardial effusion is present. Mediastinum/Nodes: No significant mediastinal or axillary adenopathy is present. Lungs/Pleura: Mild dependent atelectasis is present. No focal nodule, mass, or airspace disease is evident. Upper Abdomen: No acute abnormality. Musculoskeletal: No chest wall abnormality. No acute or significant osseous findings. Review of the MIP images confirms the above findings. IMPRESSION: Negative CTA of the chest. No acute or focal lesion to explain chest pain or shortness of breath. Electronically Signed   By: Marin Robertshristopher  Mattern M.D.   On: 06/08/2016 14:35    Procedures Procedures (including critical care time)  Medications Ordered in ED Medications  sodium chloride 0.9 % 1,000 mL infusion (not administered)  hydrALAZINE (APRESOLINE) injection 5-10 mg (not administered)  labetalol (NORMODYNE,TRANDATE) injection 20-40 mg (not administered)  magnesium bolus via infusion 4 g (not administered)  magnesium sulfate 40 g in lactated ringers 500 mL (0.08 g/mL) OB infusion (not administered)  aspirin chewable tablet 324 mg (not  administered)  iopamidol (ISOVUE-370) 76 % injection 100 mL (100 mLs Intravenous Contrast Given 06/08/16 1408)     Initial Impression / Assessment and Plan / ED Course  I have reviewed the triage vital signs and the nursing notes.  Pertinent labs & imaging results that were available during my care of the patient were reviewed by me and considered in my medical decision making (see chart for details).  Clinical Course    1- chest pain- positive troponin, ekg changes resolved here, no evidence of pe, pericardial effusion- DDx- pleuritis, myocarditis, D.W.  Discussed with Dr. SwazilandJordan- low index of suspicion for cardiac etiology with normalized ekg and atypical pain- 9:39 PM Troponin [facility].  Recheck here normal.  2- hypokalemia- oral repletion being given here-11:03 PM repeat potassium 3.5  3- hypomagnesemia- severe- iv magnesium ordered. 11:03 PM Repeat magnesium 3.1 4-pregnancy- appears stable-  5- anemia-  Discussed with [facility]. She states that Dr. Kennieth Francoisavan saw the patient there advised transfer for medical care here. Discussed the patient with hospitalist and they will not admit pregnant patient.  Discussed with Drs. Taavon and Fogelman.  Plan recheck in office on Friday.   Final Clinical Impressions(s) / ED Diagnoses   Final diagnoses:  Chest pain made worse by breathing  [redacted] weeks gestation of pregnancy  Hypokalemia  Hypomagnesemia  Chest pain, unspecified type    New Prescriptions New Prescriptions   No medications on file     Margarita Grizzleanielle Ethne Jeon, MD 06/08/16 2307

## 2016-06-08 NOTE — MAU Note (Signed)
Critical Mag level reported to Dr Ernestina PennaFogleman at desk

## 2016-06-08 NOTE — Discharge Instructions (Signed)
Take potassium as prescribed.  Drink plenty of fluids.  Call Dr. Algie CofferFogelman for recheck on Friday. Return if worse at any time.

## 2016-06-08 NOTE — MAU Provider Note (Signed)
History    Patient Gina Thompson is a 25 year old G2P0 here with chest pain and tachycardia. Denies nausea and vomiting. [facility] Monday with lightheadedness. She developed chest pain last night (Tuesday, November 28) and [facility] back [facility] to be seen this am. Dr. Algie CofferFogelman sent her to the office to be evaluated and requested GI cocktail, EKG and IV fluids. Patient drove herself here and presented to the MAU for treatment. Her EKG was ordered stat and the Patient was seen and evaluated by this provider at 1300. Patient was alert and oriented times 3 in bed; answering questions about her medical history. At approximately 1320, while her IV was being started, patient stated "I feel like I'm going to pass out".  Patient then closed her eyes and became non-responsive. The emergency response was called. 02 administered at 10 L per ml , 18 guage IVs were placed in in her left hand and right AC and NS infused. Temp was 100.2 and Blood Pressure was 176/109. With fluid replacement her blood pressure has stablized although she complains of intermittent chest pain that radiates to her back. Sepsis protocol was initiated due to fever and low blood pressure, patient was sent to CT scan. EKG reviewed with cardiologist over the phone, no acute events are visible on EKG.    Patient has a history of head trauma and seizure disorder, as well as high risk ob history. Currently this pregnancy is being followed for IUGR.   CSN: [id number]  Arrival date and time: 06/08/16 1232   None     Chief Complaint  Patient presents with  . Chest Pain  . Shortness of Breath   HPI  OB History    Gravida Para Term Preterm AB Living   2             SAB TAB Ectopic Multiple Live Births                  Past Medical History:  Diagnosis Date  . Migraine   . Pregnancy   . Seizures (HCC)     Past Surgical History:  Procedure Laterality Date  . CESAREAN SECTION    .  TONSILLECTOMY    . WISDOM TOOTH EXTRACTION      Family History  Problem Relation Age of Onset  . Bone cancer Father   . Brain cancer Paternal Grandfather   . Melanoma Paternal Grandfather   . Thyroid disease      All four grandparents  . Healthy Mother     Social History  Substance Use Topics  . Smoking status: Never Smoker  . Smokeless tobacco: Not on file  . Alcohol use 0.0 oz/week     Comment: Rarely - social settings    Allergies:  Allergies  Allergen Reactions  . Dilantin [Phenytoin Sodium Extended]     Shortness of breath  . Dilaudid [Hydromorphone Hcl]     Induce seizures  . Phenobarbital     Shortness of breath    Prescriptions Prior to Admission  Medication Sig Dispense Refill Last Dose  . Acetaminophen (TYLENOL PO) Take by mouth as needed.   Taking  . Diclofenac Potassium (CAMBIA) 50 MG PACK Take one pack at onset of migraine.  May repeat in two hours, if needed.  Max of two packs in 24 hours. 12 each 5 Taking  . dihydroergotamine (MIGRANAL) 4 MG/ML nasal spray Place 1 spray into the nose as needed  for migraine. Use in one nostril as directed.  No more than 4 sprays in one hour 8 mL 11   . ibuprofen (ADVIL,MOTRIN) 200 MG tablet Take 200 mg by mouth as needed.   Taking  . nortriptyline (PAMELOR) 10 MG capsule Take with nortriptyline 25 mg tablets, titrating to 50 mg every night 90 capsule 11   . nortriptyline (PAMELOR) 25 MG capsule 1 tablet every night for one week, then 2 tablets every night 60 capsule 3 Taking    ROS Physical Exam   Blood pressure 92/58, pulse 85, temperature 100.9 F (38.3 C), resp. rate 18, weight 60.1 kg (132 lb 9.6 oz), SpO2 100 %, unknown if currently breastfeeding.  Physical Exam  MAU Course  Procedures  MDM -EKG-shows anterior ischemia, cardiac enzymes pending.  -sepsis protocol -Ct scan of chest-unremarkable with  -FHR is a category 1 with moderate variability and no contractions. Cervix is closed and thick. FFN ordered.    Assessment and Plan  Patient is obstetrically stable and continues to complain of chest pain that radiates to her back. Current lab results show hypomagnesium, hypokalemia and anemia. Dr. Billy Coast was called to the bedside and updated the patient and her famiily. Patient to be transferred to [facility] for further evaluation and management. Dr. Ova Freshwater at [facility] ED accepted care of the patient.   Results for orders placed or performed during the hospital encounter of 06/08/16 (from the past 24 hour(s))  Glucose, capillary     Status: None   Collection Time: 06/08/16  1:31 PM  Result Value Ref Range   Glucose-Capillary 98 65 - 99 mg/dL  CBC     Status: Abnormal   Collection Time: 06/08/16  1:32 PM  Result Value Ref Range   WBC 9.7 4.0 - 10.5 K/uL   RBC 3.02 (L) 3.87 - 5.11 MIL/uL   Hemoglobin 8.0 (L) 12.0 - 15.0 g/dL   HCT 16.1 (L) 09.6 - 04.5 %   MCV 80.5 78.0 - 100.0 fL   MCH 26.5 26.0 - 34.0 pg   MCHC 32.9 30.0 - 36.0 g/dL   RDW 40.9 81.1 - 91.4 %   Platelets 259 150 - 400 K/uL  Comprehensive metabolic panel     Status: Abnormal   Collection Time: 06/08/16  1:32 PM  Result Value Ref Range   Sodium 134 (L) 135 - 145 mmol/L   Potassium 2.9 (L) 3.5 - 5.1 mmol/L   Chloride 107 101 - 111 mmol/L   CO2 21 (L) 22 - 32 mmol/L   Glucose, Bld 98 65 - 99 mg/dL   BUN 6 6 - 20 mg/dL   Creatinine, Ser 7.82 0.44 - 1.00 mg/dL   Calcium 7.9 (L) 8.9 - 10.3 mg/dL   Total Protein 6.0 (L) 6.5 - 8.1 g/dL   Albumin 2.6 (L) 3.5 - 5.0 g/dL   AST 14 (L) 15 - 41 U/L   ALT 10 (L) 14 - 54 U/L   Alkaline Phosphatase 93 38 - 126 U/L   Total Bilirubin 0.3 0.3 - 1.2 mg/dL   GFR calc non Af Amer >60 >60 mL/min   GFR calc Af Amer >60 >60 mL/min   Anion gap 6 5 - 15  Brain natriuretic peptide     Status: None   Collection Time: 06/08/16  1:33 PM  Result Value Ref Range   B Natriuretic Peptide 30.0 0.0 - 100.0 pg/mL  Type and screen [facility]      Status: None   Collection  Time:  06/08/16  1:39 PM  Result Value Ref Range   ABO/RH(D) A POS    Antibody Screen NEG    Sample Expiration 06/11/2016   D-dimer, quantitative (not at [facility])     Status: Abnormal   Collection Time: 06/08/16  2:49 PM  Result Value Ref Range   D-Dimer, Quant 1.24 (H) 0.00 - 0.50 ug/mL-FEU  Protime-INR     Status: None   Collection Time: 06/08/16  2:49 PM  Result Value Ref Range   Prothrombin Time 13.9 11.4 - 15.2 seconds   INR 1.06   APTT     Status: None   Collection Time: 06/08/16  2:49 PM  Result Value Ref Range   aPTT 26 24 - 36 seconds  Magnesium     Status: Abnormal   Collection Time: 06/08/16  2:55 PM  Result Value Ref Range   Magnesium 0.6 (LL) 1.7 - 2.4 mg/dL    Charlesetta GaribaldiKathryn Lorraine Breezie Micucci CNM 06/08/2016, 2:00 PM

## 2016-06-08 NOTE — MAU Note (Signed)
Really bad chest pain, esp if breathing in longer than a few seconds.  BP was low 80/50, then went up - but pulse was in the 120's. Just came from Dr. Elpidio EricFogleman's office.  Denies shortness of breath, states is just painful to breath. Denies cough or fever

## 2016-06-08 NOTE — ED Notes (Signed)
Pt ambulated to restroom with EMT  

## 2016-06-08 NOTE — ED Triage Notes (Signed)
[facility] onset L sided chest pain since last night.  24 weeks and 4 days pregnant FHR 155 and fetus was observed at [facility] and deemed safe to transport.  SBP 80's, hr 110 with 1L given at [facility].  Increased pain when laying flat.  CT at [facility] did not show CT.  Temp of 100.8 earlier today.

## 2016-06-08 NOTE — MAU Note (Deleted)
1300 not in lobby

## 2016-06-08 NOTE — ED Notes (Signed)
Pt ambulated to bathroom with RN assist.

## 2016-06-08 NOTE — Progress Notes (Signed)
Went in to start pt IV. Pt reported that she does not like needles. As I stuck pt with IV she stated several times, "I am going to pass out." I lowered the head of the bed and pulled call light for help. Pt HR went from 125 bpm to 50 bpm pt was unresponsive for short period. Became responsive after ammonia. Baby responded well. 2 IVs were placed during that time and NS is running.

## 2016-06-13 LAB — CULTURE, BLOOD (ROUTINE X 2): Culture: NO GROWTH

## 2016-06-14 ENCOUNTER — Other Ambulatory Visit: Payer: Self-pay | Admitting: Obstetrics

## 2016-06-18 ENCOUNTER — Encounter (HOSPITAL_COMMUNITY): Payer: Self-pay | Admitting: *Deleted

## 2016-06-18 ENCOUNTER — Inpatient Hospital Stay (HOSPITAL_COMMUNITY)
Admission: AD | Admit: 2016-06-18 | Discharge: 2016-06-18 | Discharge status: Against Medical Advice | Payer: BLUE CROSS/BLUE SHIELD | Source: Ambulatory Visit | Attending: Obstetrics and Gynecology | Admitting: Obstetrics and Gynecology

## 2016-06-18 DIAGNOSIS — Z5321 Procedure and treatment not carried out due to patient leaving prior to being seen by health care provider: Secondary | ICD-10-CM | POA: Diagnosis not present

## 2016-06-18 NOTE — MAU Note (Signed)
Urine in lab 

## 2016-06-18 NOTE — MAU Note (Signed)
Patient presents to mau with c/o fever at home (101.8-102.5), migraine, cough, sore throat, cold chills that started around 430 this afternoon and have gotten worse.

## 2016-06-18 NOTE — MAU Note (Signed)
At 2310: Patient requesting to leave and is declining lab work at this time; MD made aware and informed RN patient needs to sign AMA.   At 2315: Per patient request to leave, AMA form obtained and signed.

## 2016-06-19 NOTE — MAU Provider Note (Signed)
History     Chief Complaint  Patient presents with  . Fever  . Migraine  . Sore Throat  . Cough  . Chills  25 yo G2P0101 MWF @ [redacted] weeks gestation presents with c/o temp from 101.8 to recent 102.5 just prior to coming here. Pt reports this started this afternoon and has gotten worse. Husband and child were sick. Pt c/o sore throat, Nonproductive cough. No CP/SOB. (+) FM. Pt also c/o migraine. She did not take any med prior to coming here   OB History    Gravida Para Term Preterm AB Living   2             SAB TAB Ectopic Multiple Live Births                  Past Medical History:  Diagnosis Date  . Migraine   . Pregnancy   . Seizures (HCC)     Past Surgical History:  Procedure Laterality Date  . CESAREAN SECTION    . TONSILLECTOMY    . WISDOM TOOTH EXTRACTION      Family History  Problem Relation Age of Onset  . Bone cancer Father   . Healthy Mother   . Brain cancer Paternal Grandfather   . Melanoma Paternal Grandfather   . Thyroid disease      All four grandparents    Social History  Substance Use Topics  . Smoking status: Never Smoker  . Smokeless tobacco: Never Used  . Alcohol use 0.0 oz/week     Comment: Rarely - social settings    Allergies:  Allergies  Allergen Reactions  . Dilantin [Phenytoin Sodium Extended]     Shortness of breath  . Dilaudid [Hydromorphone Hcl]     Induce seizures  . Phenobarbital     Shortness of breath    No prescriptions prior to admission.     Physical Exam   Blood pressure 114/62, pulse 118, temperature 98.2 F (36.8 C), temperature source Oral, resp. rate 19, weight 60.4 kg (133 lb 1.9 oz), SpO2 99 %, unknown if currently breastfeeding.  General appearance: alert, cooperative and no distress Lungs: clear to auscultation bilaterally Heart: tachycardia  HEENT: pharynx; mild erythema. No exudate  Tracing: baseline 150's ED Course  IMP: URI IUP @ 31 weeks  P) cbc w/ diff, u/a. Await results MDM  addendum:  received a call from RN shortly after leaving area, pt declines lab testing and wants to leave stating our  Thermometer is wrong. Advised she will need to sign out AMA  Iyona Pehrson A, MD 6:20 AM 06/19/2016

## 2016-06-20 ENCOUNTER — Other Ambulatory Visit: Payer: Self-pay | Admitting: Obstetrics

## 2016-06-21 ENCOUNTER — Other Ambulatory Visit: Payer: Self-pay | Admitting: Obstetrics

## 2016-06-21 ENCOUNTER — Other Ambulatory Visit (HOSPITAL_COMMUNITY): Payer: Self-pay | Admitting: *Deleted

## 2016-06-21 ENCOUNTER — Ambulatory Visit (HOSPITAL_COMMUNITY)
Admission: RE | Admit: 2016-06-21 | Discharge: 2016-06-21 | Disposition: A | Discharge status: Home / Self Care | Payer: BLUE CROSS/BLUE SHIELD | Source: Ambulatory Visit | Attending: Family Medicine | Admitting: Family Medicine

## 2016-06-21 DIAGNOSIS — O99013 Anemia complicating pregnancy, third trimester: Secondary | ICD-10-CM | POA: Insufficient documentation

## 2016-06-21 DIAGNOSIS — Z3A00 Weeks of gestation of pregnancy not specified: Secondary | ICD-10-CM | POA: Insufficient documentation

## 2016-06-21 DIAGNOSIS — D509 Iron deficiency anemia, unspecified: Secondary | ICD-10-CM | POA: Diagnosis not present

## 2016-06-21 LAB — COMPREHENSIVE METABOLIC PANEL
ALT: 10 U/L — ABNORMAL LOW (ref 14–54)
AST: 19 U/L (ref 15–41)
Albumin: 2.8 g/dL — ABNORMAL LOW (ref 3.5–5.0)
Alkaline Phosphatase: 120 U/L (ref 38–126)
Anion gap: 8 (ref 5–15)
BILIRUBIN TOTAL: 0.6 mg/dL (ref 0.3–1.2)
BUN: 7 mg/dL (ref 6–20)
CHLORIDE: 106 mmol/L (ref 101–111)
CO2: 23 mmol/L (ref 22–32)
Calcium: 8.3 mg/dL — ABNORMAL LOW (ref 8.9–10.3)
Creatinine, Ser: 0.43 mg/dL — ABNORMAL LOW (ref 0.44–1.00)
Glucose, Bld: 112 mg/dL — ABNORMAL HIGH (ref 65–99)
POTASSIUM: 3.7 mmol/L (ref 3.5–5.1)
Sodium: 137 mmol/L (ref 135–145)
TOTAL PROTEIN: 6.2 g/dL — AB (ref 6.5–8.1)

## 2016-06-21 LAB — MAGNESIUM: MAGNESIUM: 1.7 mg/dL (ref 1.7–2.4)

## 2016-06-21 MED ORDER — SODIUM CHLORIDE 0.9 % IV SOLN
510.0000 mg | Freq: Once | INTRAVENOUS | Status: AC
  Administered 2016-06-21: 510 mg via INTRAVENOUS
  Filled 2016-06-21: qty 17

## 2016-06-21 NOTE — Progress Notes (Signed)
Patient ID: Riley NearingStacey C Sayler, female   DOB: 06/20/1991, 25 y.o.   MRN: [id number]   Procedure: Feraheme infusion 510 mg/100 ml 0.9% normal saline via PIV as ordered.  Provider: Noland FordyceKelly Fogleman MD  Patient tolerated infusion well. No reactions. Went over discharge instructions and copy given to patient. Alert, oriented and ambulatory at time of discharge. Discharged to home after scheduling a follow up infusion.

## 2016-06-21 NOTE — Progress Notes (Signed)
Spoke with Dr. Ernestina PennaFogleman. Pt is finished with her iron infusion. Tolerated it well. FHR tracing is a category 1.

## 2016-06-21 NOTE — Discharge Instructions (Signed)
Ferumoxytol (Injection) (Injectable) Patient Education Summary  Patient handouts containing instructions about medications  Document Language: English   Spanish Top of Page  Ferumoxytol (By injection) Ferumoxytol (fer-ue-MOX-i-tol) Treats iron deficiency anemia in patients with chronic kidney disease (CKD).  Brand Name(s):Feraheme There may be other brand names for this medicine.  When This Medicine Should Not Be Used: This medicine is not right for everyone. You should not receive it if you had an allergic reaction to ferumoxytol or to any product that contains iron. How to Use This Medicine:  Your doctor will prescribe your dose and schedule. This medicine is given through a needle placed in a vein.  A nurse or other health provider will give you this medicine.  Read and follow the patient instructions that come with this medicine. Talk to your doctor or pharmacist if you have any questions. Drugs and Foods to Avoid: Ask your doctor or pharmacist before using any other medicine, including over-the-counter medicines, vitamins, and herbal products. Warnings While Using This Medicine:  Tell your doctor if you are pregnant or breastfeeding, or if you have kidney disease, liver disease, low blood pressure, or have a history of drug allergies. Tell your doctor if you are on dialysis.  This medicine may cause the following problems:  ? Serious allergic reaction ? Low blood pressure  Tell any doctor or dentist who treats you that you are using this medicine. This medicine may affect certain medical test results.  Your doctor will do lab tests at regular visits to check on the effects of this medicine. Keep all appointments. Possible Side Effects While Using This Medicine: Call your doctor right away if you notice any of these side effects:  Allergic reaction: Itching or hives, swelling in your face or hands, swelling or tingling in your mouth or throat, chest tightness, trouble  breathing  Lightheadedness, dizziness, fainting If you notice these less serious side effects, talk with your doctor:  Pain, itching, burning, swelling, or a lump under your skin where the needle is placed If you notice other side effects that you think are caused by this medicine, tell your doctor. Call your doctor for medical advice about side effects. You may report side effects to FDA at 1-800-FDA-1088   2017 [facility]  About  Civil engineer, contractingContact  [employer] Center  User Guide  Warranty & Disclaimer  [email or web address]

## 2016-06-22 ENCOUNTER — Encounter (HOSPITAL_COMMUNITY): Payer: Self-pay | Admitting: Obstetrics and Gynecology

## 2016-06-24 ENCOUNTER — Other Ambulatory Visit: Payer: Self-pay | Admitting: Obstetrics

## 2016-06-29 ENCOUNTER — Inpatient Hospital Stay (HOSPITAL_COMMUNITY): Admission: RE | Admit: 2016-06-29 | Payer: BLUE CROSS/BLUE SHIELD | Source: Ambulatory Visit

## 2016-07-28 ENCOUNTER — Encounter (HOSPITAL_COMMUNITY): Payer: Self-pay

## 2016-07-28 ENCOUNTER — Telehealth (HOSPITAL_COMMUNITY): Payer: Self-pay | Admitting: *Deleted

## 2016-07-28 NOTE — Telephone Encounter (Signed)
Preadmission screen  

## 2016-08-05 ENCOUNTER — Encounter (HOSPITAL_COMMUNITY): Admission: RE | Admit: 2016-08-05 | Payer: BLUE CROSS/BLUE SHIELD | Source: Ambulatory Visit

## 2016-08-05 HISTORY — DX: Raynaud's syndrome without gangrene: I73.00

## 2016-08-05 HISTORY — DX: Anemia, unspecified: D64.9

## 2016-08-06 ENCOUNTER — Inpatient Hospital Stay (HOSPITAL_COMMUNITY): Admission: RE | Admit: 2016-08-06 | Payer: BLUE CROSS/BLUE SHIELD | Source: Ambulatory Visit | Admitting: Obstetrics

## 2016-08-06 SURGERY — Surgical Case
Anesthesia: Regional

## 2017-04-13 ENCOUNTER — Encounter (HOSPITAL_COMMUNITY): Payer: Self-pay

## 2018-04-22 IMAGING — CT CT ANGIO CHEST
1 of 2 series · 19 of 32 positions shown · IV contrast (OMNIPAQUE)
Comparison: None.

CLINICAL DATA: Chest pain. Severe shortness of breath. Third
trimester pregnancy. Twenty-nine weeks pregnant.

EXAM:
CT ANGIOGRAPHY CHEST WITH CONTRAST
TECHNIQUE: Multidetector CT imaging of the chest was performed using the
standard protocol during bolus administration of intravenous
contrast. Multiplanar CT image reconstructions and MIPs were
obtained to evaluate the vascular anatomy.
CONTRAST:  100 mL Isovue 370

[Series 9: (person_name) thins · axial · 0.64mm/px · z∈[-234,-20]mm · 19 of 342 slices shown]
[im 18/342  lung]
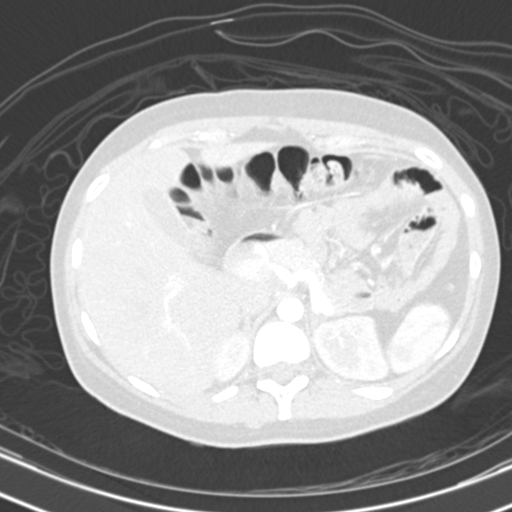
[im 35/342  mediastinal]
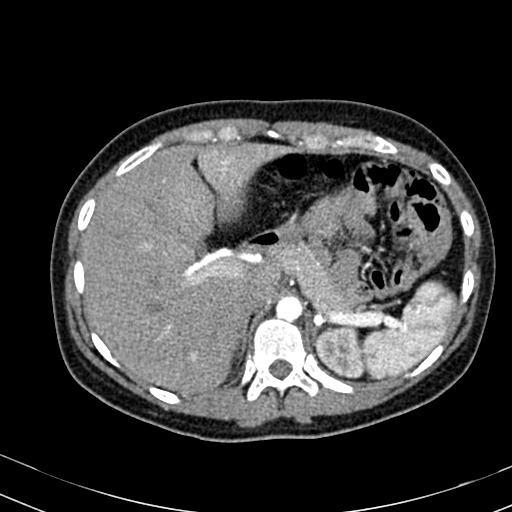
[im 52/342  lung]
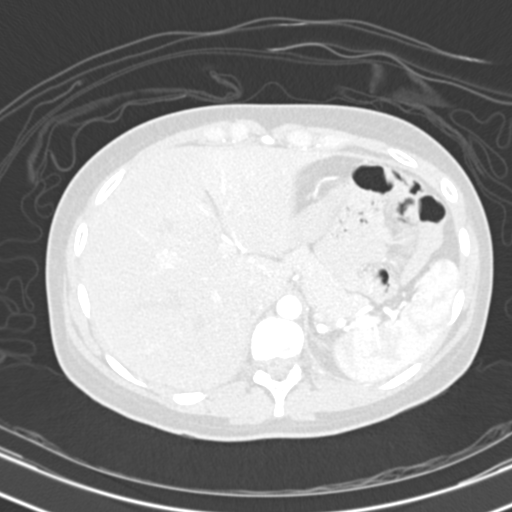
[im 86/342  mediastinal]
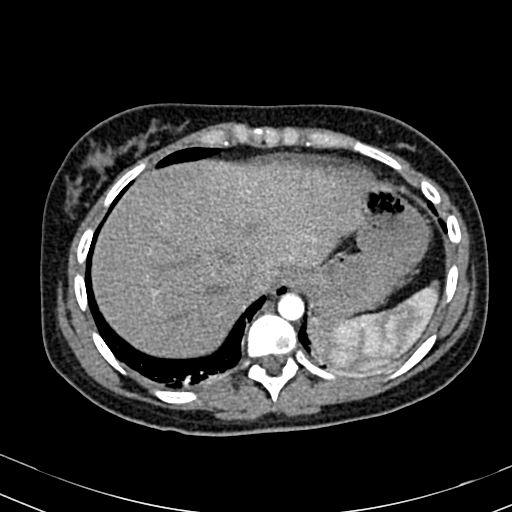
[im 103/342  lung]
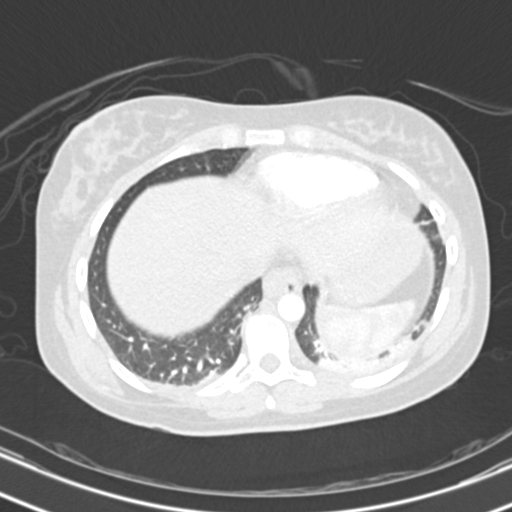
[im 114/342  mediastinal]
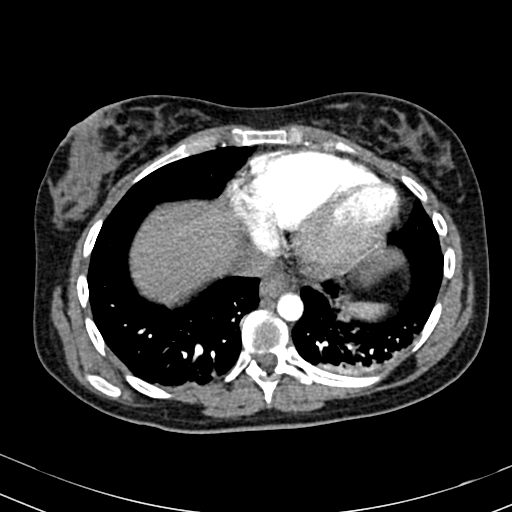
[im 120/342  lung]
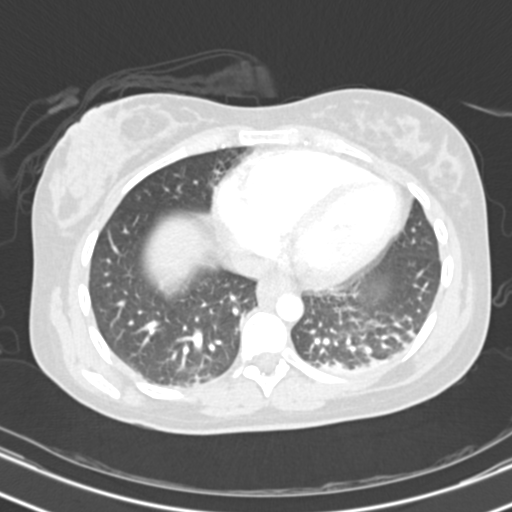
[im 137/342  mediastinal]
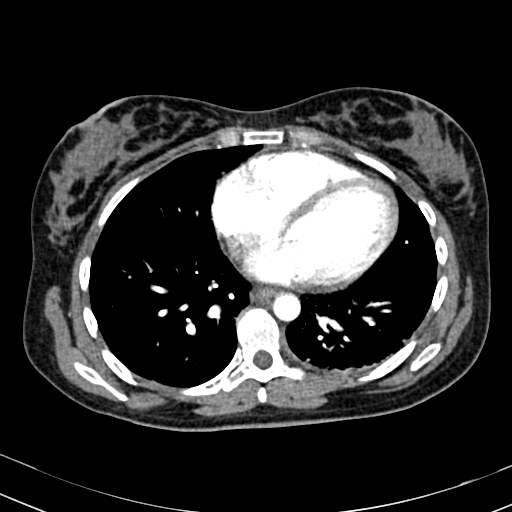
[im 154/342  lung]
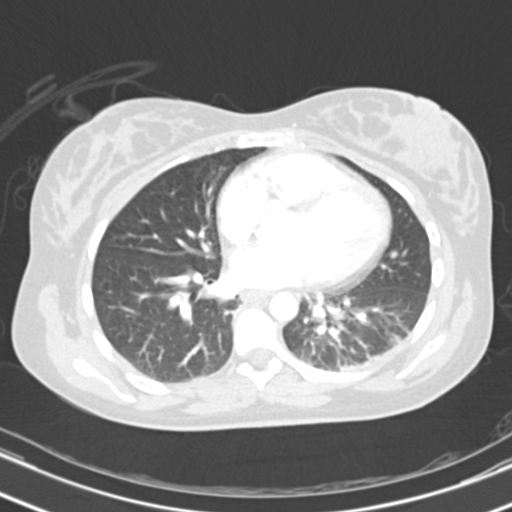
[im 171/342  mediastinal]
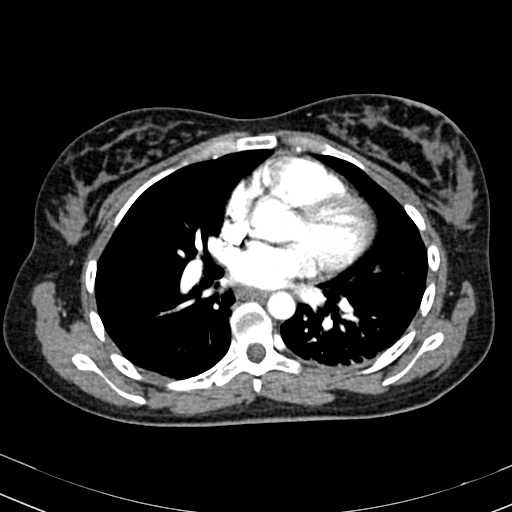
[im 188/342  lung]
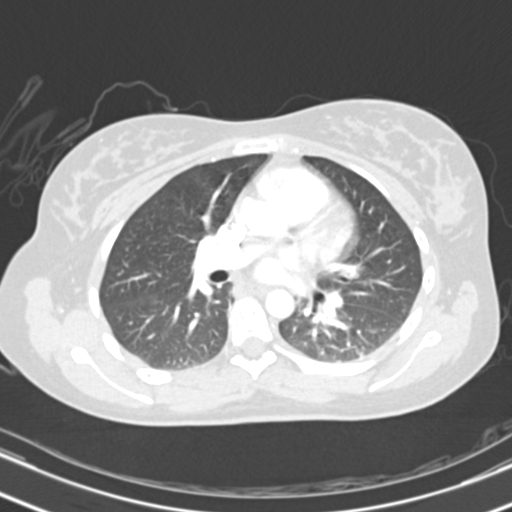
[im 205/342  mediastinal]
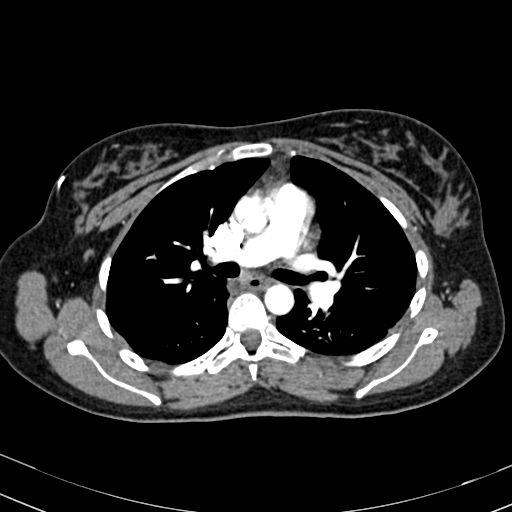
[im 222/342  lung]
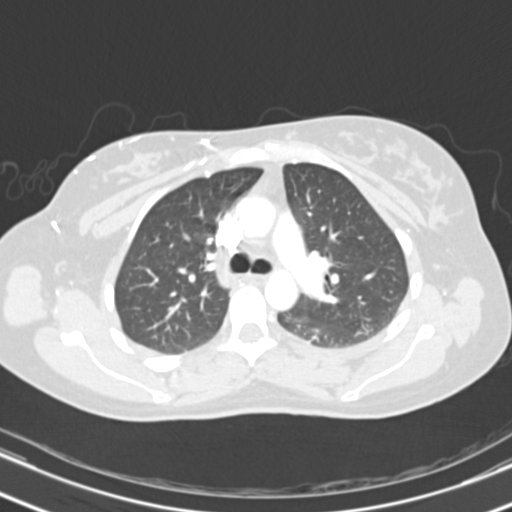
[im 228/342  mediastinal]
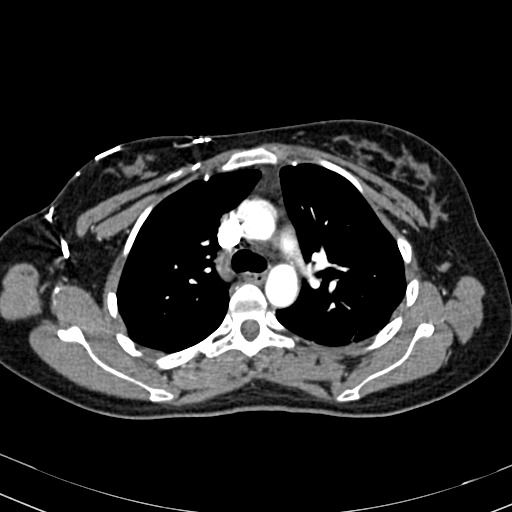
[im 239/342  lung]
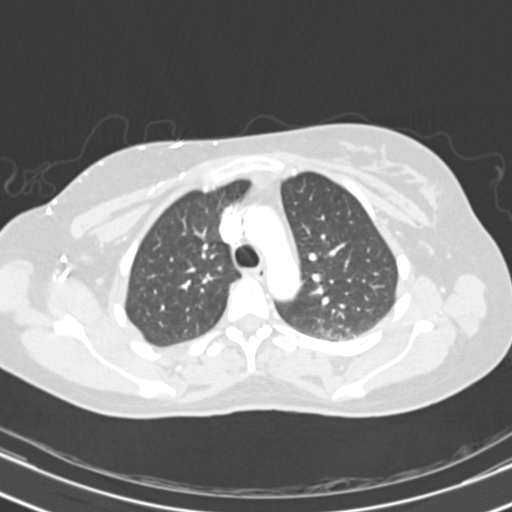
[im 256/342  mediastinal]
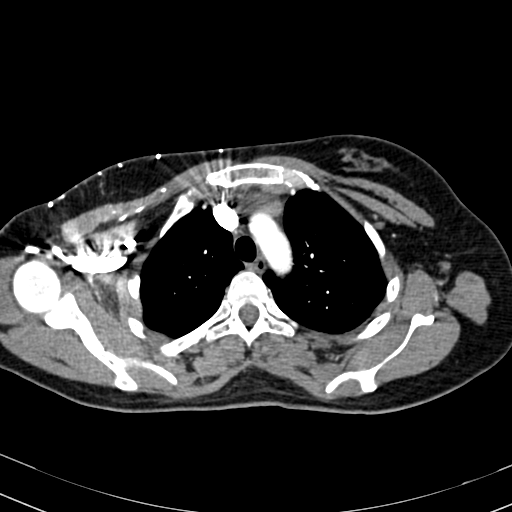
[im 290/342  lung]
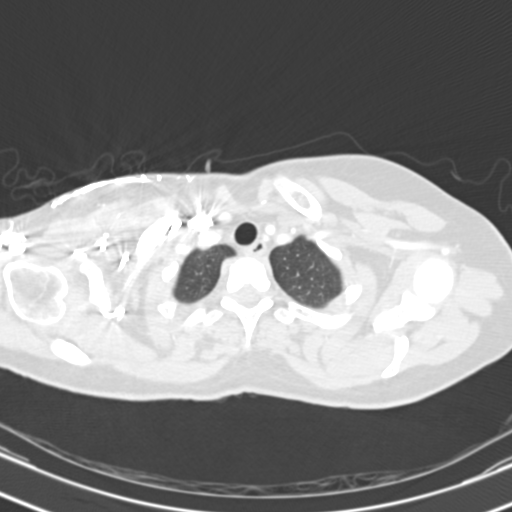
[im 307/342  mediastinal]
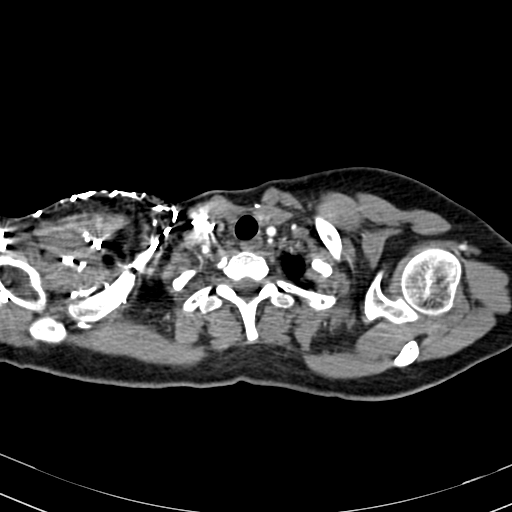
[im 324/342  lung]
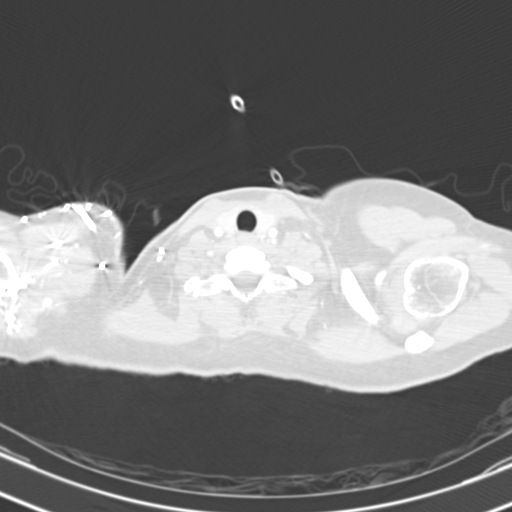

[19 of 32 positions shown; findings below may reference images not displayed]

FINDINGS: Cardiovascular: Pulmonary trial opacification is excellent. No focal
filling defects are evident to suggest pulmonary emboli. The heart
size is normal. No significant pleural or pericardial effusion is
present.

Mediastinum/Nodes: No significant mediastinal or axillary adenopathy
is present.

Lungs/Pleura: Mild dependent atelectasis is present. No focal
nodule, mass, or airspace disease is evident.

Upper Abdomen: No acute abnormality.

Musculoskeletal: No chest wall abnormality. No acute or significant
osseous findings.

Review of the MIP images confirms the above findings.
IMPRESSION: Negative CTA of the chest. No acute or focal lesion to explain chest
pain or shortness of breath.
